# Patient Record
Sex: Female | Born: 1975 | Race: Black or African American | Hispanic: No | Marital: Single | Smoking: Never smoker
Health system: Southern US, Community
[De-identification: ages and names within clinical notes are randomized; demographics above are authoritative.]

## PROBLEM LIST (undated history)

## (undated) HISTORY — PX: TUBAL LIGATION: SHX77

---

## 2019-04-05 ENCOUNTER — Other Ambulatory Visit: Payer: Self-pay

## 2019-04-05 DIAGNOSIS — Z20822 Contact with and (suspected) exposure to covid-19: Secondary | ICD-10-CM

## 2019-04-09 LAB — NOVEL CORONAVIRUS, NAA: SARS-CoV-2, NAA: NOT DETECTED

## 2019-04-12 ENCOUNTER — Emergency Department (HOSPITAL_COMMUNITY): Payer: 59

## 2019-04-12 ENCOUNTER — Other Ambulatory Visit: Payer: Self-pay

## 2019-04-12 ENCOUNTER — Encounter (HOSPITAL_COMMUNITY): Payer: Self-pay | Admitting: Emergency Medicine

## 2019-04-12 ENCOUNTER — Emergency Department (HOSPITAL_COMMUNITY)
Admission: EM | Admit: 2019-04-12 | Discharge: 2019-04-12 | Disposition: A | Discharge status: Home / Self Care | Payer: 59 | Attending: Emergency Medicine | Admitting: Emergency Medicine

## 2019-04-12 DIAGNOSIS — R0981 Nasal congestion: Secondary | ICD-10-CM | POA: Insufficient documentation

## 2019-04-12 DIAGNOSIS — J029 Acute pharyngitis, unspecified: Secondary | ICD-10-CM | POA: Diagnosis not present

## 2019-04-12 DIAGNOSIS — R05 Cough: Secondary | ICD-10-CM | POA: Insufficient documentation

## 2019-04-12 DIAGNOSIS — Z20828 Contact with and (suspected) exposure to other viral communicable diseases: Secondary | ICD-10-CM | POA: Insufficient documentation

## 2019-04-12 DIAGNOSIS — R059 Cough, unspecified: Secondary | ICD-10-CM

## 2019-04-12 MED ORDER — BENZONATATE 200 MG PO CAPS: 200.0000 mg | ORAL_CAPSULE | Freq: Three times a day (TID) | ORAL | 0 refills | Status: DC | PRN

## 2019-04-12 NOTE — Discharge Instructions (Signed)
Tylenol every 4 hours if needed for fever or body aches.  Your COVID testing results should be back within 48 hours.  You will be notified of any positive results.  You may also access your results on MyChart.  Follow-up with your primary doctor or return to the ER for any worsening symptoms such as persistent high fever or shortness of breath.

## 2019-04-12 NOTE — ED Triage Notes (Addendum)
C/o congestion and cough since last Tues.  Fever Sunday and Monday of last week but no fever since then.  Here today for cough, mainly at night.  Slight sore throat on and off, per pt.  Pt reports being around her step-son, who was positive for COVID.

## 2019-04-13 LAB — NOVEL CORONAVIRUS, NAA (HOSP ORDER, SEND-OUT TO REF LAB; TAT 18-24 HRS): SARS-CoV-2, NAA: NOT DETECTED

## 2019-04-13 NOTE — ED Provider Notes (Signed)
[facility] EMERGENCY DEPARTMENT Provider Note   CSN: [id number] Arrival date & time: 04/12/19  0920     History   Chief Complaint Chief Complaint  Patient presents with  . Cough    HPI Ellen Foster is a 43 y.o. female.     HPI   Ellen Foster is a 43 y.o. female who presents to the Emergency Department complaining of cough and chest congestion for one week. She was tested at the local drive by testing center one week ago, but has not been notified of her results.  Subjective fever last week and states that she has been exposed to a family member with [place] since her testing.  She comes to the ER today due to persistent cough and intermittent sore throat.  She denies shortness of breath, abdominal or chest pain, vomiting, and recurrent fever.     History reviewed. No pertinent past medical history.  There are no active problems to display for this patient.   Past Surgical History:  Procedure Laterality Date  . TUBAL LIGATION       OB History    Gravida  1   Para      Term      Preterm      AB      Living        SAB      TAB      Ectopic      Multiple      Live Births               Home Medications    Prior to Admission medications   Medication Sig Start Date End Date Taking? Authorizing Provider  benzonatate (TESSALON) 200 MG capsule Take 1 capsule (200 mg total) by mouth 3 (three) times daily as needed for cough. Swallow whole, do not chew 04/12/19   Inetta Dicke, PA-C    Family History History reviewed. No pertinent family history.  Social History Social History   Tobacco Use  . Smoking status: Never Smoker  . Smokeless tobacco: Never Used  Substance Use Topics  . Alcohol use: Yes    Alcohol/week: 2.0 standard drinks    Types: 2 Glasses of wine per week    Comment: per month  . Drug use: Never     Allergies   Patient has no known allergies.   Review of Systems Review of Systems  Constitutional: Negative for  activity change, appetite change, chills and fever.  HENT: Positive for sore throat. Negative for congestion, ear pain, facial swelling, trouble swallowing and voice change.   Eyes: Negative for pain and visual disturbance.  Respiratory: Positive for cough. Negative for shortness of breath.   Cardiovascular: Negative for chest pain.  Gastrointestinal: Negative for abdominal pain, diarrhea, nausea and vomiting.  Musculoskeletal: Negative for arthralgias, myalgias, neck pain and neck stiffness.  Skin: Negative for color change and rash.  Neurological: Negative for dizziness, facial asymmetry, speech difficulty, numbness and headaches.  Hematological: Negative for adenopathy.     Physical Exam Updated Vital Signs BP 113/75 (BP Location: Right Arm)   Pulse 60   Temp 98.7 F (37.1 C) (Oral)   Resp 16   Ht 5\' 6"  (1.676 m)   Wt 88.5 kg   LMP 03/22/2019 (Approximate)   SpO2 98%   BMI 31.47 kg/m   Physical Exam Vitals signs and nursing note reviewed.  Constitutional:      Appearance: Normal appearance. She is not ill-appearing or toxic-appearing.  HENT:  Head: Atraumatic.     Right Ear: Tympanic membrane and ear canal normal.     Left Ear: Tympanic membrane and ear canal normal.     Mouth/Throat:     Mouth: Mucous membranes are moist.     Pharynx: Uvula midline. Posterior oropharyngeal erythema present. No pharyngeal swelling, oropharyngeal exudate or uvula swelling.     Tonsils: No tonsillar exudate or tonsillar abscesses.     Comments: Erythema of the oropharynx w/o edema or exudate.  Uvula is midline and non-edematous.   Neck:     Musculoskeletal: Normal range of motion.  Cardiovascular:     Rate and Rhythm: Normal rate and regular rhythm.     Pulses: Normal pulses.  Pulmonary:     Effort: Pulmonary effort is normal.     Breath sounds: Normal breath sounds.  Chest:     Chest wall: No tenderness.  Abdominal:     Palpations: Abdomen is soft.     Tenderness: There is no  abdominal tenderness. There is no guarding.  Musculoskeletal: Normal range of motion.  Lymphadenopathy:     Cervical: No cervical adenopathy.  Skin:    General: Skin is warm.     Capillary Refill: Capillary refill takes less than 2 seconds.     Findings: No rash.  Neurological:     General: No focal deficit present.     Sensory: No sensory deficit.     Motor: No weakness.      ED Treatments / Results  Labs (all labs ordered are listed, but only abnormal results are displayed) Labs Reviewed  NOVEL CORONAVIRUS, NAA (HOSPITAL ORDER, SEND-OUT TO REF LAB)    EKG None  Radiology Dg Chest Portable 1 View  Result Date: 04/12/2019 CLINICAL DATA:  Congestion and cough since last Tuesday. Fever "Sunday and Monday of last week. Intermittent sore throat. COVID-19 exposure. EXAM: PORTABLE CHEST 1 VIEW COMPARISON:  Chest x-ray dated 02/10/2017. FINDINGS: The heart size and mediastinal contours are within normal limits. Both lungs are clear. The visualized skeletal structures are unremarkable. IMPRESSION: No active disease.  No evidence of pneumonia. Electronically Signed   By: Stan  Maynard M.D.   On: 04/12/2019 11:41    Procedures Procedures (including critical care time)  Medications Ordered in ED Medications - No data to display   Initial Impression / Assessment and Plan / ED Course  I have reviewed the triage vital signs and the nursing notes.  Pertinent labs & imaging results that were available during my care of the patient were reviewed by me and considered in my medical decision making (see chart for details).       Pt is well appearing.  Vitals reviewed.  CXR is reassuring.  No tachycardia, hypoxia or dyspnea.  Initial COVID testing reviewed and results were negative, describes a COVID exposure possibly after her testing, I will repeat it today. She appears stable for d/c home and agrees to self isolate until her results are back and she is at least 7 days w/o sx's.  Return  precautions discussed.     Final Clinical Impressions(s) / ED Diagnoses   Final diagnoses:  Cough    ED Discharge Orders         Ordered    benzonatate (TESSALON) 200 MG capsule  3 times daily PRN     07" /21/20 1216           Pauline Ausriplett, Aslyn Cottman, PA-C 04/13/19 1652    Benjiman CorePickering, Nathan, MD 04/18/19 2147

## 2019-09-20 ENCOUNTER — Ambulatory Visit: Payer: 59 | Attending: Internal Medicine

## 2019-09-20 ENCOUNTER — Other Ambulatory Visit: Payer: Self-pay

## 2019-09-20 DIAGNOSIS — Z20822 Contact with and (suspected) exposure to covid-19: Secondary | ICD-10-CM

## 2019-09-21 LAB — NOVEL CORONAVIRUS, NAA: SARS-CoV-2, NAA: NOT DETECTED

## 2019-12-03 ENCOUNTER — Ambulatory Visit: Payer: 59

## 2019-12-05 ENCOUNTER — Other Ambulatory Visit (HOSPITAL_COMMUNITY): Payer: Self-pay | Admitting: Obstetrics and Gynecology

## 2019-12-05 DIAGNOSIS — Z1231 Encounter for screening mammogram for malignant neoplasm of breast: Secondary | ICD-10-CM

## 2019-12-08 ENCOUNTER — Encounter (HOSPITAL_COMMUNITY): Payer: Self-pay

## 2019-12-08 ENCOUNTER — Encounter (HOSPITAL_COMMUNITY): Payer: 59

## 2019-12-08 ENCOUNTER — Other Ambulatory Visit: Payer: Self-pay

## 2019-12-08 ENCOUNTER — Ambulatory Visit (HOSPITAL_COMMUNITY)
Admission: RE | Admit: 2019-12-08 | Discharge: 2019-12-08 | Disposition: A | Discharge status: Home / Self Care | Payer: 59 | Source: Ambulatory Visit

## 2019-12-08 ENCOUNTER — Other Ambulatory Visit (HOSPITAL_COMMUNITY): Payer: Self-pay | Admitting: Obstetrics and Gynecology

## 2019-12-08 DIAGNOSIS — Z1231 Encounter for screening mammogram for malignant neoplasm of breast: Secondary | ICD-10-CM

## 2019-12-13 ENCOUNTER — Inpatient Hospital Stay
Admission: RE | Admit: 2019-12-13 | Discharge: 2019-12-13 | Disposition: A | Discharge status: Home / Self Care | Payer: Self-pay | Source: Ambulatory Visit | Attending: Obstetrics and Gynecology | Admitting: Obstetrics and Gynecology

## 2019-12-13 ENCOUNTER — Other Ambulatory Visit (HOSPITAL_COMMUNITY): Payer: Self-pay | Admitting: Obstetrics and Gynecology

## 2019-12-13 DIAGNOSIS — Z1231 Encounter for screening mammogram for malignant neoplasm of breast: Secondary | ICD-10-CM

## 2019-12-14 ENCOUNTER — Other Ambulatory Visit (HOSPITAL_COMMUNITY): Payer: Self-pay | Admitting: Obstetrics and Gynecology

## 2019-12-14 DIAGNOSIS — R928 Other abnormal and inconclusive findings on diagnostic imaging of breast: Secondary | ICD-10-CM

## 2020-01-10 ENCOUNTER — Ambulatory Visit (HOSPITAL_COMMUNITY)
Admission: RE | Admit: 2020-01-10 | Discharge: 2020-01-10 | Disposition: A | Discharge status: Home / Self Care | Payer: 59 | Source: Ambulatory Visit | Attending: Obstetrics and Gynecology | Admitting: Obstetrics and Gynecology

## 2020-01-10 ENCOUNTER — Other Ambulatory Visit: Payer: Self-pay

## 2020-01-10 DIAGNOSIS — R928 Other abnormal and inconclusive findings on diagnostic imaging of breast: Secondary | ICD-10-CM

## 2020-02-04 ENCOUNTER — Ambulatory Visit
Admission: EM | Admit: 2020-02-04 | Discharge: 2020-02-04 | Disposition: A | Discharge status: Home / Self Care | Payer: 59 | Attending: Emergency Medicine | Admitting: Emergency Medicine

## 2020-02-04 DIAGNOSIS — M7522 Bicipital tendinitis, left shoulder: Secondary | ICD-10-CM

## 2020-02-04 DIAGNOSIS — M79602 Pain in left arm: Secondary | ICD-10-CM

## 2020-02-04 MED ORDER — PREDNISONE 20 MG PO TABS: 20.0000 mg | ORAL_TABLET | Freq: Two times a day (BID) | ORAL | 0 refills | Status: AC

## 2020-02-04 NOTE — ED Triage Notes (Signed)
Pt presents with c/o left shoulder and arm pain that began yestserday, pt denies injury .

## 2020-02-04 NOTE — Discharge Instructions (Signed)
Continue conservative management of rest, ice, and gentle stretches Prednisone prescribed. Take as directed and to completion Follow up with PCP or orthopedist if symptoms persist Return or go to the ER if you have any new or worsening symptoms (fever, chills, chest pain, shortness of breath, numbness, tingling, weakness, etc...)

## 2020-02-04 NOTE — ED Provider Notes (Signed)
[place]   [id number] 02/04/20 Arrival Time: 0807  CC: LT arm pain  SUBJECTIVE: History from: patient. Ilanna Deihl is a 44 y.o. female complains of LT bicep pain x 1-2 weeks.  Denies a precipitating event or specific injury.  Does admit to lifting at work.  Localizes the pain to the bicep.  Describes the pain as constant and dull in character.  Has tried OTC medications without relief.  Symptoms are made worse with laying on her LT side.  Denies similar symptoms in the past.  Denies fever, chills, erythema, ecchymosis, effusion, weakness, numbness and tingling.  ROS: As per HPI.  All other pertinent ROS negative.     History reviewed. No pertinent past medical history. Past Surgical History:  Procedure Laterality Date  . TUBAL LIGATION     No Known Allergies No current facility-administered medications on file prior to encounter.   No current outpatient medications on file prior to encounter.   Social History   Socioeconomic History  . Marital status: Single    Spouse name: Not on file  . Number of children: Not on file  . Years of education: Not on file  . Highest education level: Not on file  Occupational History  . Not on file  Tobacco Use  . Smoking status: Never Smoker  . Smokeless tobacco: Never Used  Substance and Sexual Activity  . Alcohol use: Yes    Alcohol/week: 2.0 standard drinks    Types: 2 Glasses of wine per week    Comment: per month  . Drug use: Never  . Sexual activity: Yes  Other Topics Concern  . Not on file  Social History Narrative  . Not on file   Social Determinants of Health   Financial Resource Strain:   . Difficulty of Paying Living Expenses:   Food Insecurity:   . Worried About Charity fundraiser in the Last Year:   . Arboriculturist in the Last Year:   Transportation Needs:   . Film/video editor (Medical):   Marland Kitchen Lack of Transportation (Non-Medical):   Physical Activity:   . Days of Exercise per Week:   .  Minutes of Exercise per Session:   Stress:   . Feeling of Stress :   Social Connections:   . Frequency of Communication with Friends and Family:   . Frequency of Social Gatherings with Friends and Family:   . Attends Religious Services:   . Active Member of Clubs or Organizations:   . Attends Archivist Meetings:   Marland Kitchen Marital Status:   Intimate Partner Violence:   . Fear of Current or Ex-Partner:   . Emotionally Abused:   Marland Kitchen Physically Abused:   . Sexually Abused:    History reviewed. No pertinent family history.  OBJECTIVE:  Vitals:   02/04/20 0811  BP: 128/81  Pulse: 81  Resp: 16  Temp: 98.3 F (36.8 C)  TempSrc: Oral  SpO2: 96%    General appearance: ALERT; in no acute distress.  Head: NCAT Lungs: Normal respiratory effort; CTAB CV: RRR Musculoskeletal: LT arm  Inspection: Skin warm, dry, clear and intact without obvious erythema, effusion, or ecchymosis.  Palpation: diffusely TTP over bicep ROM: FROM active and passive Strength: 5/5 shld abduction, 5/5 shld adduction, 5/5 elbow flexion, 5/5 elbow extension, 5/5 grip strength Skin: warm and dry Neurologic: Ambulates without difficulty; Sensation intact about the upper/ lower extremities Psychological: alert and cooperative; normal mood and affect   ASSESSMENT & PLAN:  1. Left arm pain   2. Biceps tendinitis on left     Meds ordered this encounter  Medications  . predniSONE (DELTASONE) 20 MG tablet    Sig: Take 1 tablet (20 mg total) by mouth 2 (two) times daily with a meal for 5 days.    Dispense:  10 tablet    Refill:  0    Order Specific Question:   Supervising Provider    Answer:   Eustace Moore [[id number]]    Continue conservative management of rest, ice, and gentle stretches Prednisone prescribed. Take as directed and to completion Follow up with PCP or orthopedist if symptoms persist Return or go to the ER if you have any new or worsening symptoms (fever, chills, chest pain, shortness  of breath, numbness, tingling, weakness, etc...)   Reviewed expectations re: course of current medical issues. Questions answered. Outlined signs and symptoms indicating need for more acute intervention. Patient verbalized understanding. After Visit Summary given.    Rennis Harding, PA-C 02/04/20 0840

## 2020-09-19 ENCOUNTER — Other Ambulatory Visit (HOSPITAL_COMMUNITY): Payer: Self-pay | Admitting: Obstetrics and Gynecology

## 2020-09-19 DIAGNOSIS — N63 Unspecified lump in unspecified breast: Secondary | ICD-10-CM

## 2020-12-19 ENCOUNTER — Other Ambulatory Visit (HOSPITAL_COMMUNITY): Payer: Self-pay | Admitting: Student

## 2021-06-18 IMAGING — US US BREAST*L* LIMITED INC AXILLA
1 series · 11 of 11 positions shown · non-contrast
Comparison: Screening mammogram dated 07/13/2018

CLINICAL DATA: Patient was recalled for an abnormality in the left
breast on screening mammogram dated 07/13/2018, however did not
return for diagnostic imaging.

EXAM:
DIGITAL DIAGNOSTIC BILATERAL MAMMOGRAM WITH CAD AND TOMO
LEFT BREAST ULTRASOUND

[Series 1: us breast ltd uni left inc axilla · 11 of 11 slices shown]
[im 1/11]
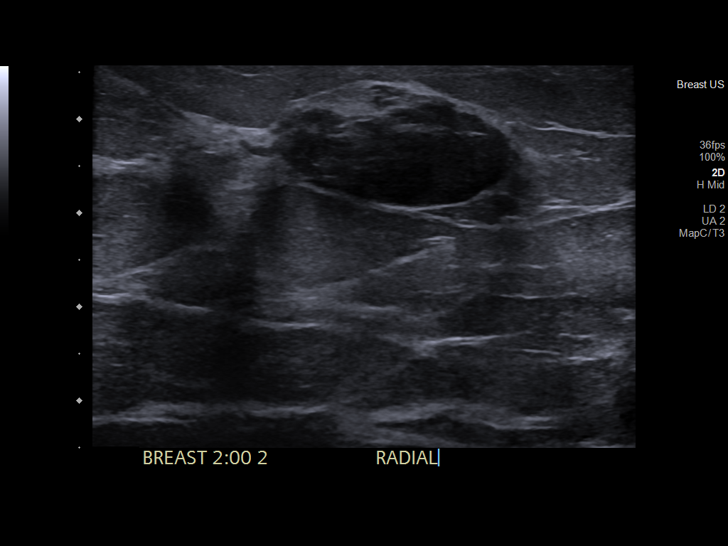
[im 2/11]
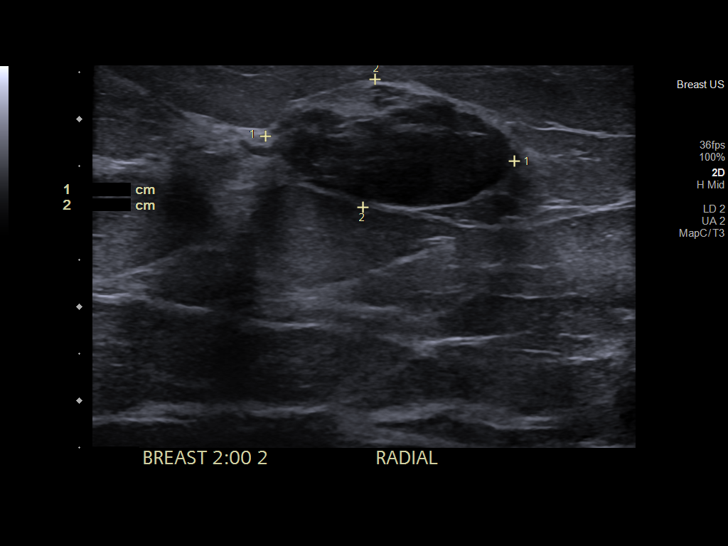
[im 3/11]
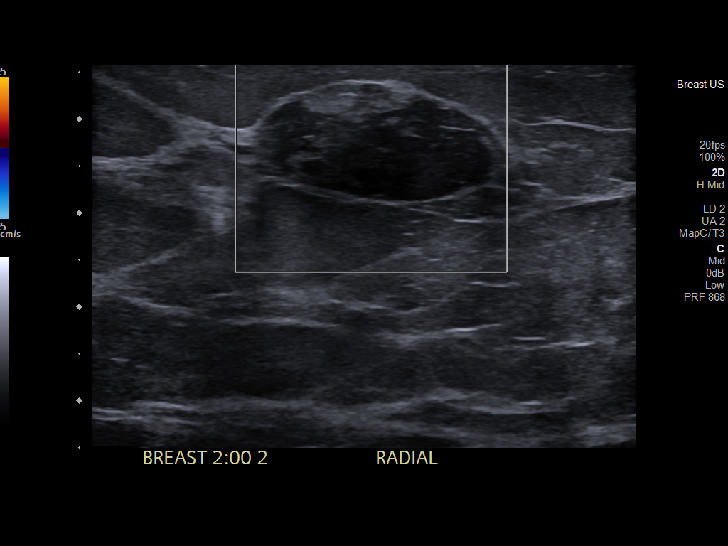
[im 4/11]
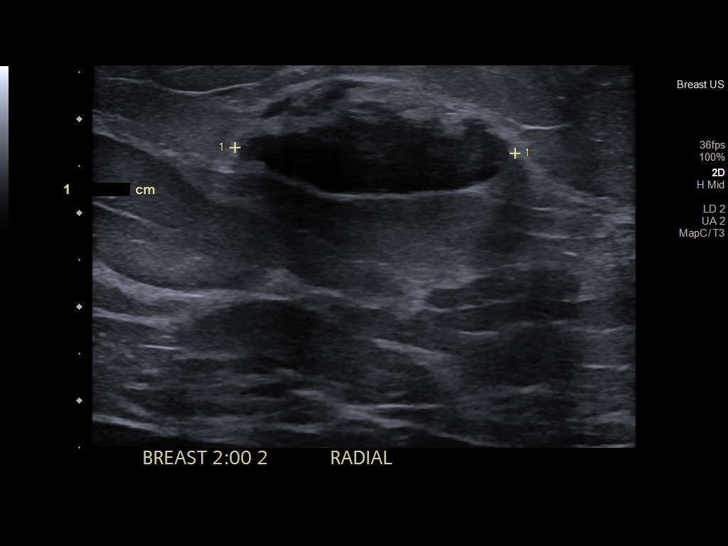
[im 5/11]
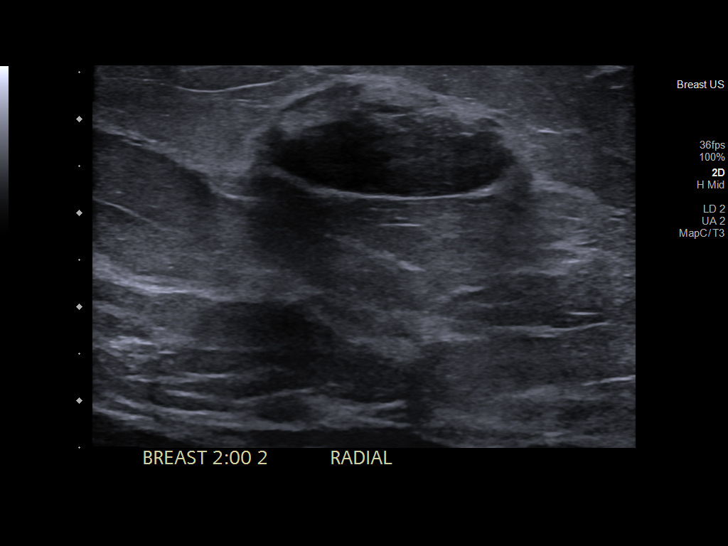
[im 6/11]
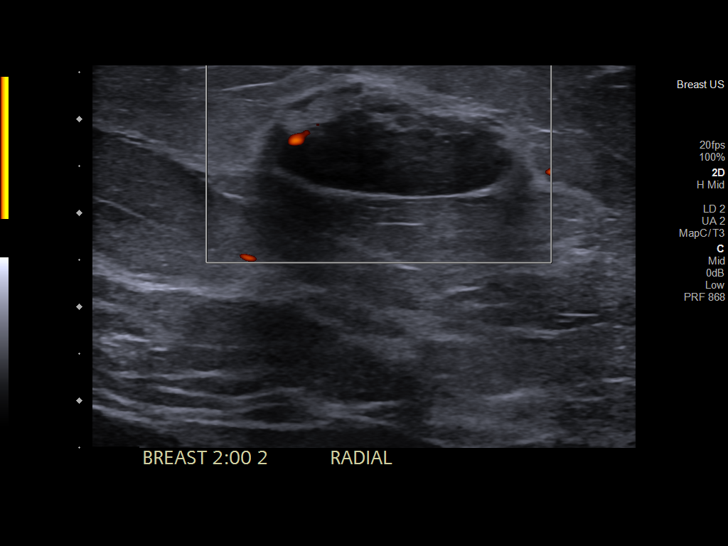
[im 7/11]
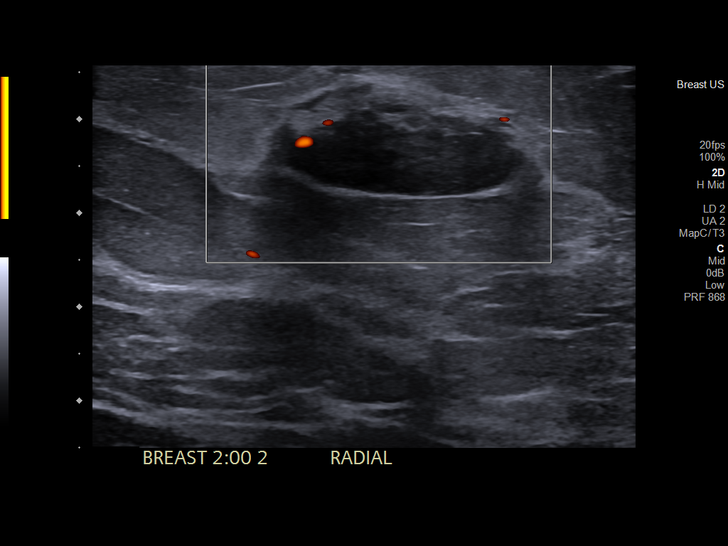
[im 8/11]
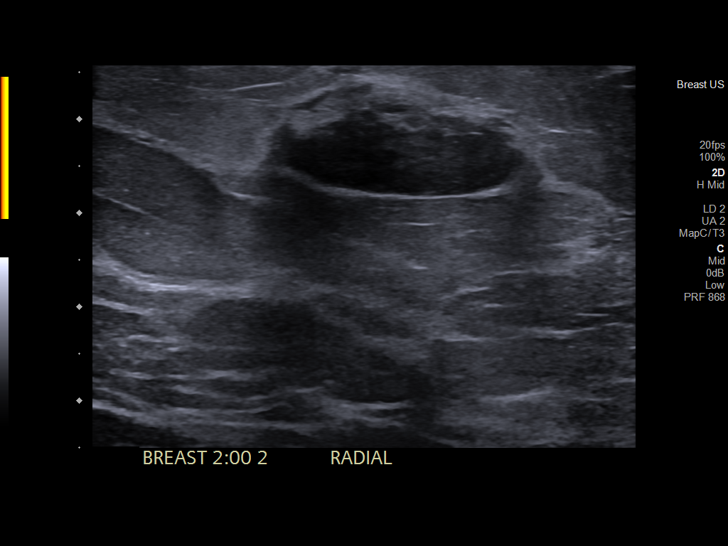
[im 9/11]
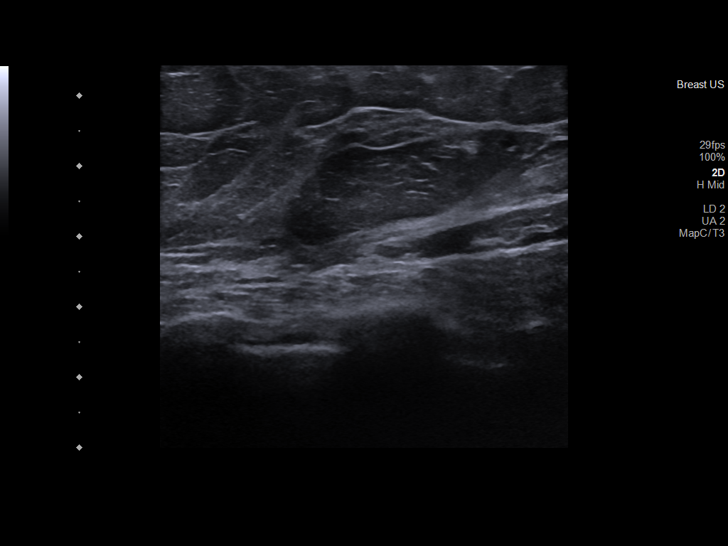
[im 10/11]
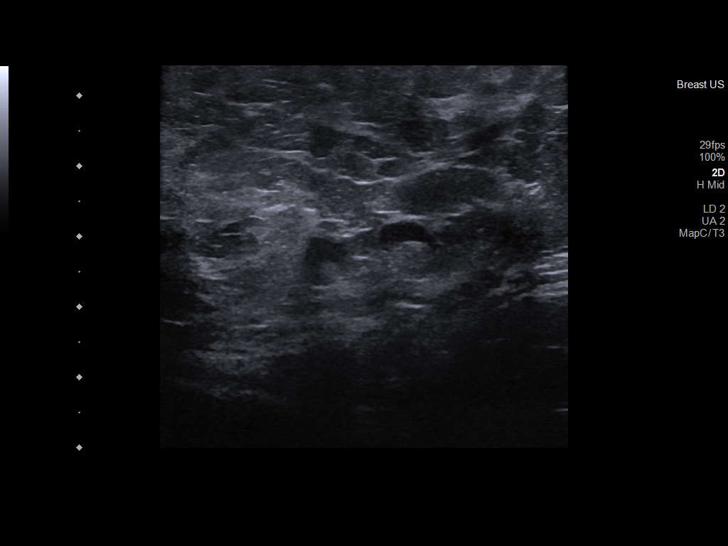
[im 11/11]
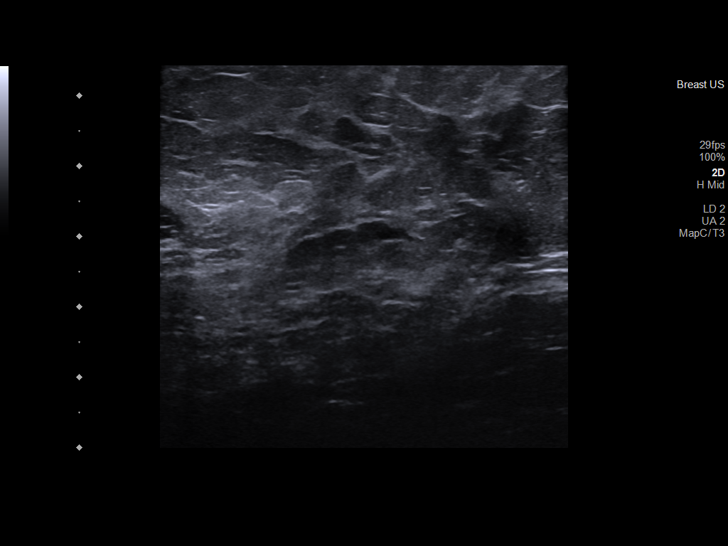

[11 of 11 positions shown; findings below may reference images not displayed]

ACR Breast Density Category b: There are scattered areas of
fibroglandular density.
FINDINGS: No suspicious masses or calcifications are seen in the right breast.
There is an oval circumscribed mass in the upper-outer left breast
measuring approximately 2.8 cm, similar in appearance when compared
to the prior mammogram.

Mammographic images were processed with CAD.

Targeted ultrasound of the left breast was performed. There is an
oval well-circumscribed mixed echogenicity predominantly hypoechoic
mass in the left breast at 2 o'clock 2 cm from nipple. This measures
3 x 1.4 x 2.7 cm and demonstrates imaging features most suggestive
of a benign fibroadenoma.
IMPRESSION: Probably benign left breast mass.

RECOMMENDATION:
Left breast ultrasound in 6 months.

I have discussed the findings and recommendations with the patient.
If applicable, a reminder letter will be sent to the patient
regarding the next appointment.

BI-RADS CATEGORY  3: Probably benign.

## 2021-06-18 IMAGING — MG DIGITAL DIAGNOSTIC BILAT W/ TOMO W/ CAD
8 series · 8 of 24 positions shown · non-contrast
Comparison: Screening mammogram dated 07/13/2018

CLINICAL DATA: Patient was recalled for an abnormality in the left
breast on screening mammogram dated 07/13/2018, however did not
return for diagnostic imaging.

EXAM:
DIGITAL DIAGNOSTIC BILATERAL MAMMOGRAM WITH CAD AND TOMO
LEFT BREAST ULTRASOUND

[L MLO synth-2D]
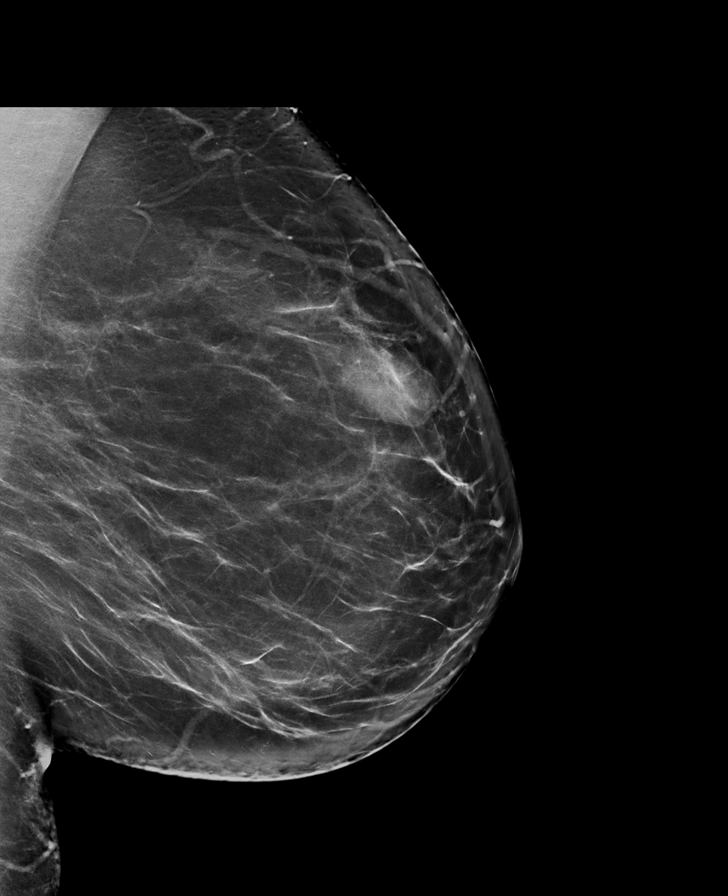

[R MLO synth-2D]
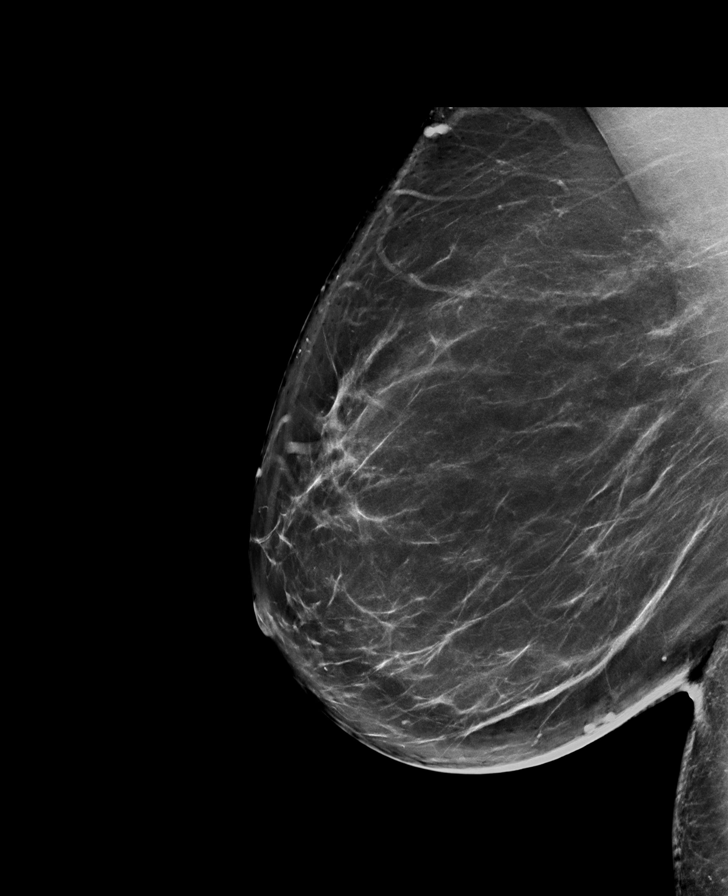

[L CC synth-2D]
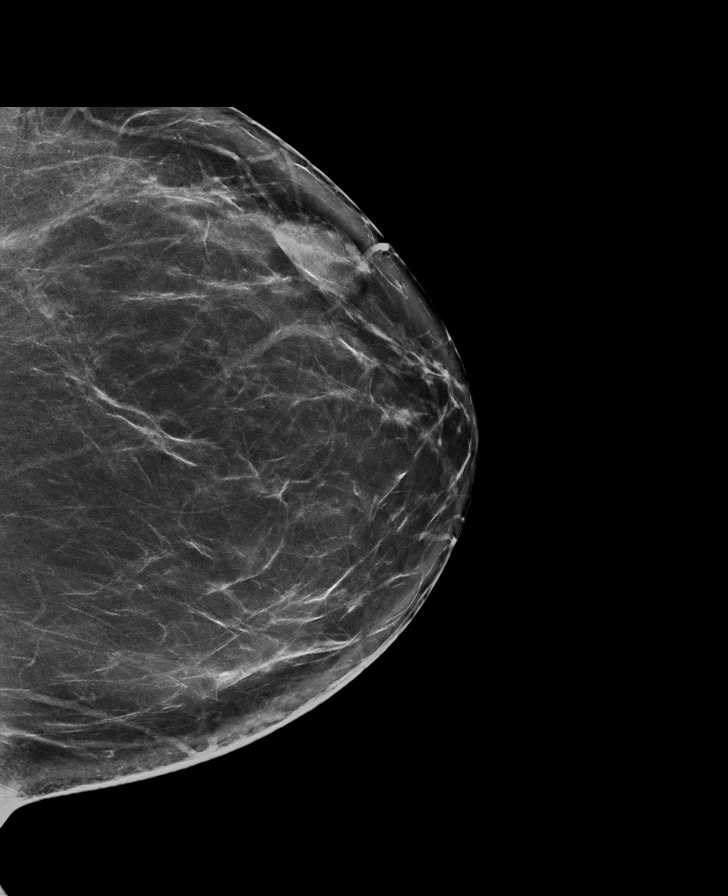

[R CC synth-2D]
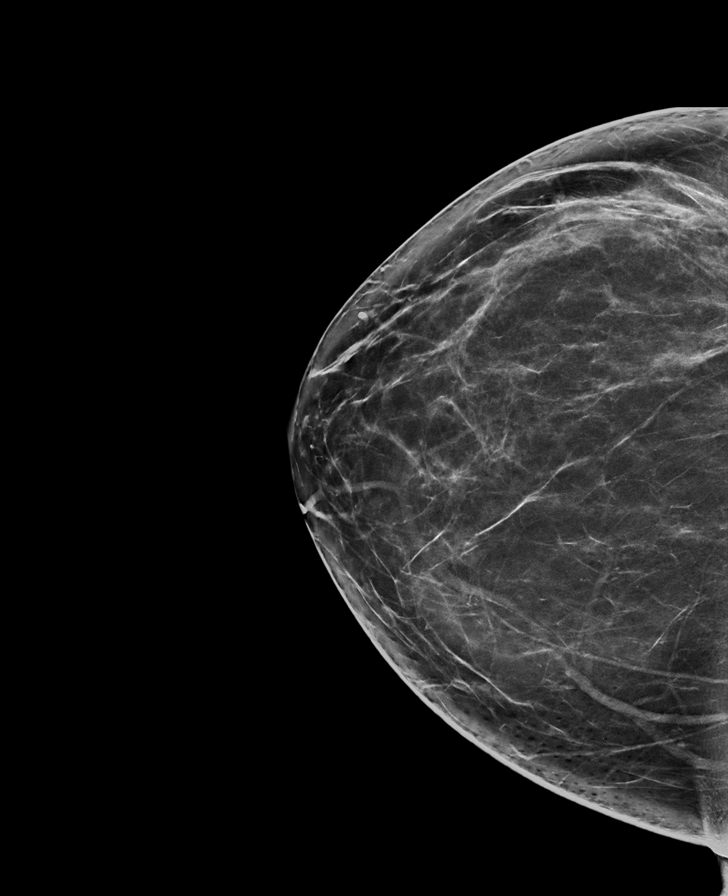

[L MLO tomo · tomo slice 50/99.0]
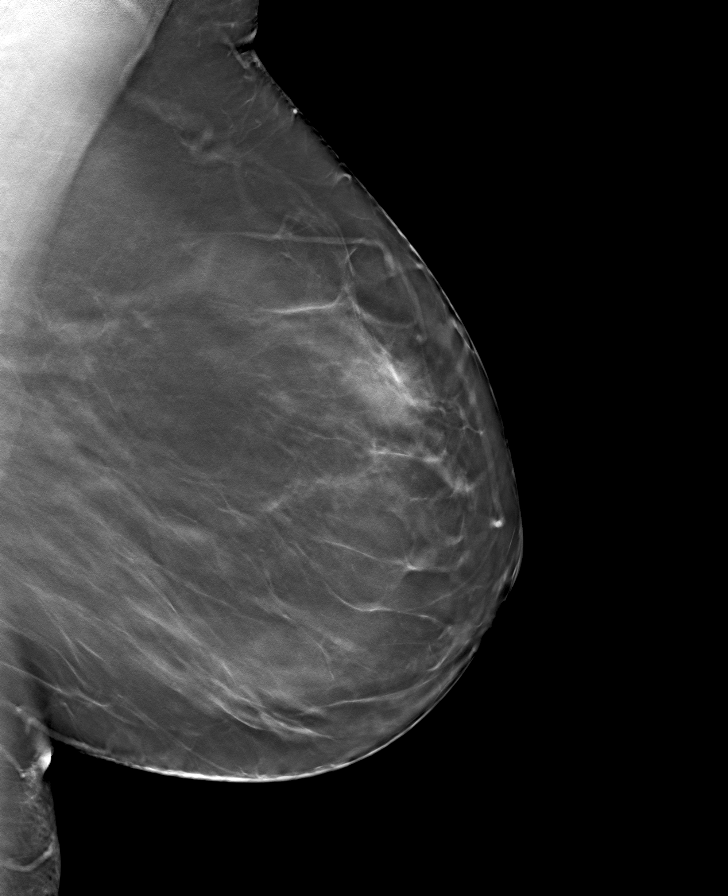

[L CC tomo · tomo slice 47/93.0]
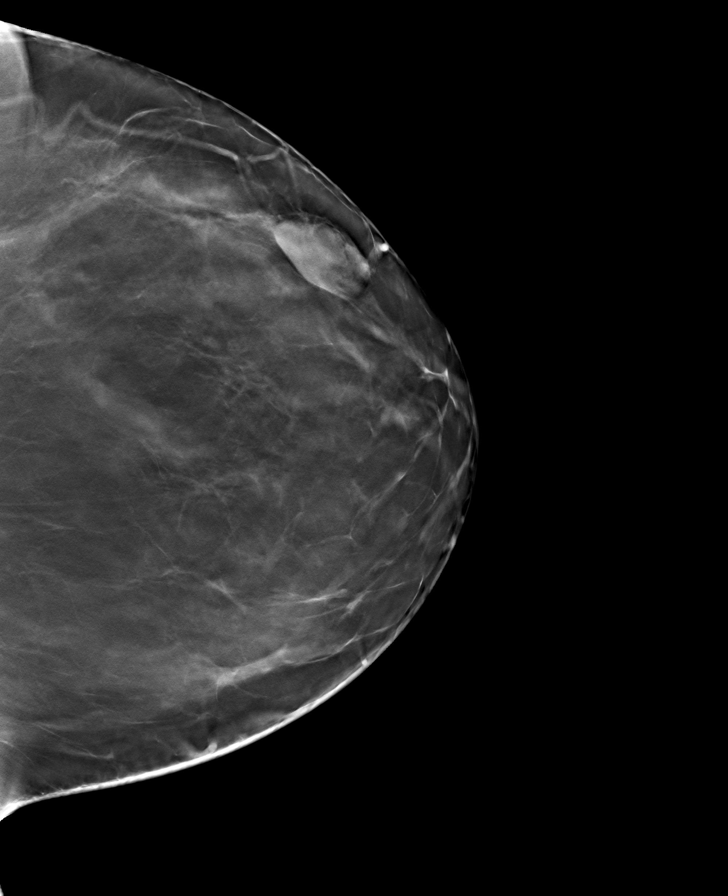

[R MLO tomo · tomo slice 49/96.0]
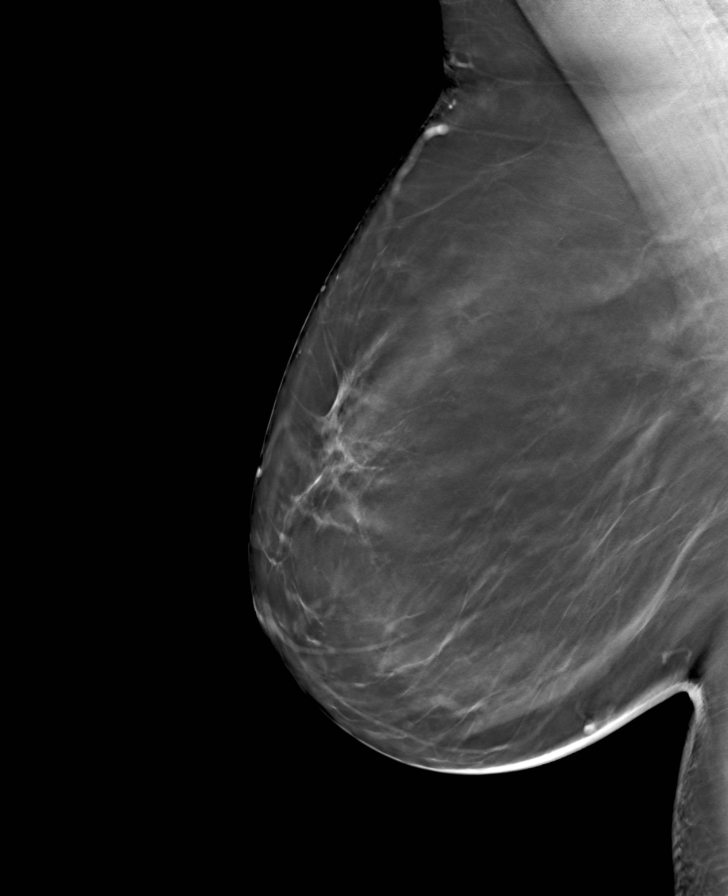

[R CC tomo · tomo slice 42/83.0]
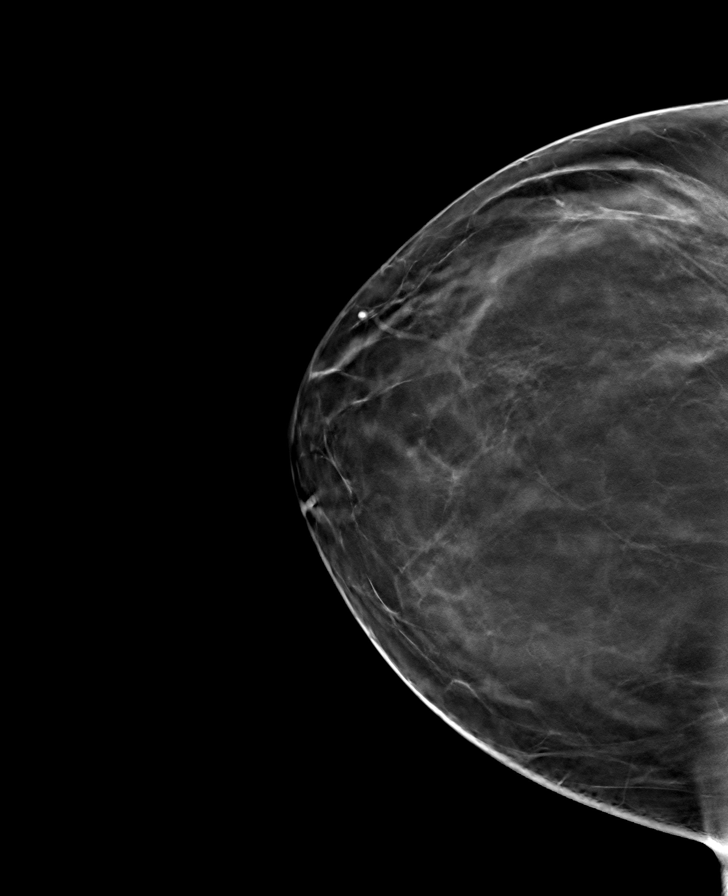

[8 of 24 positions shown; findings below may reference images not displayed]

ACR Breast Density Category b: There are scattered areas of
fibroglandular density.
FINDINGS: No suspicious masses or calcifications are seen in the right breast.
There is an oval circumscribed mass in the upper-outer left breast
measuring approximately 2.8 cm, similar in appearance when compared
to the prior mammogram.

Mammographic images were processed with CAD.

Targeted ultrasound of the left breast was performed. There is an
oval well-circumscribed mixed echogenicity predominantly hypoechoic
mass in the left breast at 2 o'clock 2 cm from nipple. This measures
3 x 1.4 x 2.7 cm and demonstrates imaging features most suggestive
of a benign fibroadenoma.
IMPRESSION: Probably benign left breast mass.

RECOMMENDATION:
Left breast ultrasound in 6 months.

I have discussed the findings and recommendations with the patient.
If applicable, a reminder letter will be sent to the patient
regarding the next appointment.

BI-RADS CATEGORY  3: Probably benign.

## 2023-11-04 NOTE — Progress Notes (Unsigned)
  Intake history:  There were no vitals taken for this visit. There is no height or weight on file to calculate BMI.    WHAT ARE WE SEEING YOU FOR TODAY?   {Location ext ZOXW:96045}  How long has this bothered you? (DOI?DOS?WS?)  {TIME; AGO:17960}  Anticoag.  No  Diabetes No  Heart disease No  Hypertension No  SMOKING HX No  Kidney disease No  Any ALLERGIES ______________________________________________   Treatment:  Have you taken:  Tylenol {yes/no:20286}  Advil {yes/no:20286}  Had PT {yes/no:20286}  Had injection {yes/no:20286}  Other  _________________________

## 2023-11-06 ENCOUNTER — Ambulatory Visit: Payer: 59 | Admitting: Orthopedic Surgery

## 2023-11-06 ENCOUNTER — Encounter: Payer: Self-pay | Admitting: Orthopedic Surgery

## 2023-11-06 ENCOUNTER — Other Ambulatory Visit (INDEPENDENT_AMBULATORY_CARE_PROVIDER_SITE_OTHER): Payer: 59

## 2023-11-06 VITALS — BP 181/115 | HR 92 | Ht 66.0 in | Wt 190.0 lb

## 2023-11-06 DIAGNOSIS — G8929 Other chronic pain: Secondary | ICD-10-CM

## 2023-11-06 DIAGNOSIS — M25512 Pain in left shoulder: Secondary | ICD-10-CM

## 2023-11-06 DIAGNOSIS — M542 Cervicalgia: Secondary | ICD-10-CM

## 2023-11-06 DIAGNOSIS — M5412 Radiculopathy, cervical region: Secondary | ICD-10-CM | POA: Diagnosis not present

## 2023-11-06 MED ORDER — GABAPENTIN 100 MG PO CAPS: 100.0000 mg | ORAL_CAPSULE | Freq: Three times a day (TID) | ORAL | 2 refills | Status: AC

## 2023-11-06 MED ORDER — PREDNISONE 10 MG (48) PO TBPK: ORAL_TABLET | Freq: Every day | ORAL | 0 refills | Status: DC

## 2023-11-06 MED ORDER — TIZANIDINE HCL 4 MG PO TABS: 4.0000 mg | ORAL_TABLET | Freq: Four times a day (QID) | ORAL | 0 refills | Status: DC | PRN

## 2023-11-06 NOTE — Progress Notes (Signed)
Subjective:    Patient ID: Ellen Foster, female    DOB: Apr 27, 1976, 48 y.o.   MRN: [id number]  This is a 48 year old female comes to [place] with 2 months history of left shoulder neck and arm pain associated with numbness and tingling unrelieved by Tylenol Advil or IM injection  She reports no trauma or MVA.  Neck Pain       Review of Systems  Musculoskeletal:  Positive for neck pain.       Objective:   Physical Exam Vitals and nursing note reviewed.  Constitutional:      Appearance: Normal appearance.  HENT:     Head: Normocephalic and atraumatic.  Eyes:     General: No scleral icterus.       Right eye: No discharge.        Left eye: No discharge.     Extraocular Movements: Extraocular movements intact.     Conjunctiva/sclera: Conjunctivae normal.     Pupils: Pupils are equal, round, and reactive to light.  Cardiovascular:     Rate and Rhythm: Normal rate.     Pulses: Normal pulses.  Musculoskeletal:     Comments: Left shoulder examination reveals full range of motion and normal rotator cuff strength no signs of impingement or instability  Cervical spine exam  Flexion extension reproduces some of the pain no pain with rotation positive Spurling to the left  Tenderness in the mid cervical spine trapezius muscle and medial scapula  Slight weak grip on the left normal if not hyperreflexive at the elbow wrist and hand.  Negative signs of rotator cuff syndrome or tear    Skin:    General: Skin is warm and dry.     Capillary Refill: Capillary refill takes less than 2 seconds.  Neurological:     General: No focal deficit present.     Mental Status: She is alert and oriented to person, place, and time.  Psychiatric:        Mood and Affect: Mood normal.        Behavior: Behavior normal.        Thought Content: Thought content normal.        Judgment: Judgment normal.           Assessment & Plan:   Encounter Diagnoses  Name Primary?   Cervical spine pain     Acute pain of left shoulder    Radiculopathy, cervical region Yes    DG Cervical Spine 2 or 3 views Result Date: 11/06/2023 Imaging cervical spine Neck and shoulder pain left side.  Some numbness and tingling. Overall the cervical alignment shows loss of lordosis in the lateral x-ray Uncovertebral joints look normal disc spaces look preserved Normal bony anatomy, loss of cervical lordosis   DG Shoulder Left Result Date: 11/06/2023 X-ray report Chief complaint shoulder pain with numbness left arm Images 2 v left shoulder Reading: normal Humeral and glenoid.  Normal humeral head height Impression: Normal x-ray    Recommend physical therapy  Recommend medication  Does not appear to be surgical without at least attempts at medical management  I told her to call us back in 4 weeks if not better and we can send her to Dr. Christell Constant for further management  Meds ordered this encounter  Medications   DISCONTD: predniSONE (STERAPRED UNI-PAK 48 TAB) 10 MG (48) TBPK tablet    Sig: Take by mouth daily.    Dispense:  48 tablet    Refill:  0   gabapentin (  NEURONTIN) 100 MG capsule    Sig: Take 1 capsule (100 mg total) by mouth 3 (three) times daily.    Dispense:  90 capsule    Refill:  2   tiZANidine (ZANAFLEX) 4 MG tablet    Sig: Take 1 tablet (4 mg total) by mouth every 6 (six) hours as needed for muscle spasms.    Dispense:  30 tablet    Refill:  0   predniSONE (STERAPRED UNI-PAK 48 TAB) 10 MG (48) TBPK tablet    Sig: Take by mouth daily. 10 mg ds 12 days    Dispense:  48 tablet    Refill:  0

## 2023-12-26 ENCOUNTER — Other Ambulatory Visit: Payer: Self-pay | Admitting: Orthopedic Surgery

## 2024-03-25 ENCOUNTER — Other Ambulatory Visit: Payer: Self-pay | Admitting: Orthopedic Surgery

## 2024-03-25 DIAGNOSIS — M542 Cervicalgia: Secondary | ICD-10-CM

## 2024-03-25 DIAGNOSIS — M5412 Radiculopathy, cervical region: Secondary | ICD-10-CM

## 2024-06-01 ENCOUNTER — Other Ambulatory Visit: Payer: Self-pay | Admitting: Orthopedic Surgery

## 2024-08-31 ENCOUNTER — Other Ambulatory Visit: Payer: Self-pay | Admitting: Orthopedic Surgery
